# Patient Record
Sex: Male | Born: 1990 | Race: Asian | Hispanic: No | Marital: Single | Smoking: Never smoker
Health system: Southern US, Community
[De-identification: ages and names within clinical notes are randomized; demographics above are authoritative.]

## PROBLEM LIST (undated history)

## (undated) HISTORY — PX: PLEURAL SCARIFICATION: SHX748

## (undated) HISTORY — PX: HERNIA REPAIR: SHX51

---
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
## 2011-01-29 NOTE — Discharge Summary (Signed)
NAMENGAI, PARCELL NO.:  [id number]   MEDICAL RECORD NO.:  [id number]          PATIENT TYPE:  INP   LOCATION:  6118                         FACILITY:  [facility]   PHYSICIAN:  Henrietta Hoover, MD    DATE OF BIRTH:  [birth year]   DATE OF ADMISSION:  05/30/2008  DATE OF DISCHARGE:  06/02/2008                               DISCHARGE SUMMARY   SIGNIFICANT FINDINGS:  Christopher Moody is a 20 year old that presents with a 4-day  history of chest pain.  The patient noted severe onset of chest pain and  some shortness of breath and then presented to his primary care  physician's office where he got an EKG and was sent to [facility] for x-  rays which revealed a 40% left pneumothorax.  The patient was treated  with placement of a left chest tube.  Subsequent x-ray showed decrease  of the apical pneumothorax to 10%.  Chest tube was pulled and by  discharge the x-ray showed only a minimal apical pneumothorax which had  been stable since previous x-ray.  On discharge, the patient was without  pain with good oxygen saturation.   TREATMENT:  O2 by nasal cannula left chest tube.   FINAL DIAGNOSIS:  Left pneumothorax, resolved.   DISCHARGE MEDICATIONS AND INSTRUCTIONS:  Ibuprofen 600 mg q.6 h. as  needed for pain.  The patient was instructed to keep Tegaderm in place  until Tuesday.  He may shower but asked not to take bath or go swimming.  The patient was instructed to return if he has severe chest pain,  shortness of breath, or other concerns.   FOLLOWUP:  Followup at [facility] on Tuesday, June 07, 2008, at 4:30 p.m.  Follow up with Cardiology (for a previous issue) Dr.  Brion Aliment on June 09, 2008, at 1:20 p.m.   DISCHARGE WEIGHT:  64 kg.   DISCHARGE CONDITION:  Stable.      Pediatrics Resident      Henrietta Hoover, MD  Electronically Signed    PR/MEDQ  D:  06/02/2008  T:  06/03/2008  Job:  [id number]

## 2011-02-01 NOTE — Discharge Summary (Signed)
NAMEJUVENCIO, VERDI                   ACCOUNT NO.:  [id number]   MEDICAL RECORD NO.:  [id number]          PATIENT TYPE:  OBV   LOCATION:  6121                         FACILITY:  [facility]   PHYSICIAN:  Orie Rout, M.D.DATE OF BIRTH:  [birth year]   DATE OF ADMISSION:  07/10/2006  DATE OF DISCHARGE:  07/11/2006                                 DISCHARGE SUMMARY   REASON FOR HOSPITALIZATION:  Abdominal pain, sharp right-sided, 10/10.  The  purpose was to rule out appendicitis or other causes of acute abdomen.   HISTORY OF PRESENT ILLNESS:  The patient presented to the hospital with  sharp right-sided, 10/10, abdominal pain that went from the right lower  quadrant to the periumbilical area and then back to the right lower  quadrant.  This started around noon the day of admission.  For a full HPI,  please see the patient's H&P for this admission.   HOSPITAL COURSE:  Because the history was concerning for appendicitis, a CT  of the abdomen and pelvis was ordered.  This CT showed that the tip of the  appendix did not fill.  However, there were no inflammatory changes found on  the film and there was no free fluid seen in the abdomen or pelvis.  It was  thought, at this time, that the CT did not point to or against appendicitis.  The patient, however, was made n.p.o. and given 1 dose of morphine for the  pain, which relieved it to a 0/10 immediately.  The initial labs that were  ordered for the patient were a CBC, a CMP, a urinalysis, and coags.  The  white blood cell count was within normal limits at 6.6 and the only  abnormality seen from the other tests was a total bilirubin of 1.6.  Overnight serial abdominal exams were performed.  None of these exams were  concerning for appendicitis or peritonitis.  Also overnight, he did not  require any further pain medications.  In the morning, a.m. labs were drawn.  This was the morning of July 11, 2006.  A CBC, amylase, and a lipase were  drawn.  There were no abnormalities.  The white blood cell count had only  mildly increased from 6.6 to 8.0.  On exam the morning after admission, the  patient's pain was gone.  He did not exhibit any rebound tenderness or  guarding, or tenderness to palpation, and he remained afebrile throughout  his stay.  On the day after admission, he was able to tolerate clears and  tolerate solids.  He was also seen by Dr. Levie Heritage, the pediatric surgeon, who  felt that the patient was safely ruled out for appendicitis and could be  discharged home at that time.   OPERATIONS AND PROCEDURES:  None were performed during this admission.   FINAL DIAGNOSIS:  Abdominal pain, unspecified.  Appendicitis was felt to be  safely ruled out.   DISCHARGE MEDICATIONS AND INSTRUCTIONS:  The patient was given Tylenol 500  mg every 6 hours as needed for pain.  He was also instructed to return to  the hospital for worsening abdominal pain, return of any other symptoms, and  also a fever to greater than 101.4 degrees.   FOLLOWUP:  The patient is scheduled to follow up with Dr. Erik Obey on  Monday, July 14, 2006, at 2:30 p.m.   DISCHARGE WEIGHT:  50 kg.   DISCHARGE CONDITION:  Stable/improved.   The written form of this discharge summary was sent to Dr. Erik Obey at [phone].           ______________________________  Orie Rout, M.D.     OA/MEDQ  D:  07/11/2006  T:  07/12/2006  Job:  [id number]

## 2011-06-07 LAB — I-STAT 8, (EC8 V) (CONVERTED LAB)
BUN: 13
Chloride: 108
Glucose, Bld: 90
HCT: 44
pCO2, Ven: 48.9
pH, Ven: 7.294

## 2011-06-07 LAB — URINALYSIS, ROUTINE W REFLEX MICROSCOPIC
Bilirubin Urine: NEGATIVE
Glucose, UA: NEGATIVE
Hgb urine dipstick: NEGATIVE
Ketones, ur: NEGATIVE
Nitrite: NEGATIVE
Protein, ur: NEGATIVE
Specific Gravity, Urine: 1.025
Urobilinogen, UA: 0.2
pH: 5.5

## 2011-06-07 LAB — DIFFERENTIAL
Basophils Absolute: 0
Basophils Relative: 0
Eosinophils Absolute: 0.2
Eosinophils Relative: 3
Lymphocytes Relative: 41
Lymphs Abs: 3.1
Monocytes Absolute: 0.5
Monocytes Relative: 7
Neutro Abs: 3.8
Neutrophils Relative %: 50

## 2011-06-07 LAB — INFLUENZA A+B VIRUS AG-DIRECT(RAPID): Influenza B Ag: NEGATIVE

## 2011-06-07 LAB — RAPID URINE DRUG SCREEN, HOSP PERFORMED
Amphetamines: NOT DETECTED
Barbiturates: NOT DETECTED
Benzodiazepines: NOT DETECTED
Cocaine: NOT DETECTED
Opiates: NOT DETECTED
Tetrahydrocannabinol: NOT DETECTED

## 2011-06-07 LAB — CBC
HCT: 41.5
Hemoglobin: 14.1
MCHC: 34
MCV: 86.6
Platelets: 157
RBC: 4.79
RDW: 13.6
WBC: 7.6

## 2011-06-07 LAB — COMPREHENSIVE METABOLIC PANEL
ALT: 10
AST: 16
Albumin: 3.9
Alkaline Phosphatase: 74
BUN: 15
CO2: 26
Calcium: 8.7
Chloride: 109
Creatinine, Ser: 0.94
Glucose, Bld: 95
Potassium: 4.9
Sodium: 138
Total Bilirubin: 0.6
Total Protein: 6.4

## 2011-06-07 LAB — CK: Total CK: 63

## 2011-06-07 LAB — CULTURE, BLOOD (ROUTINE X 2): Culture: NO GROWTH

## 2011-06-07 LAB — POCT I-STAT CREATININE
Creatinine, Ser: 1.1
Operator id: 288331

## 2011-06-07 LAB — LIPASE, BLOOD: Lipase: 26

## 2011-06-07 LAB — MONONUCLEOSIS SCREEN: Mono Screen: NEGATIVE

## 2012-09-05 ENCOUNTER — Encounter (HOSPITAL_COMMUNITY): Payer: Self-pay | Admitting: *Deleted

## 2012-09-05 ENCOUNTER — Emergency Department (HOSPITAL_COMMUNITY): Payer: BC Managed Care – PPO

## 2012-09-05 ENCOUNTER — Emergency Department (HOSPITAL_COMMUNITY)
Admission: EM | Admit: 2012-09-05 | Discharge: 2012-09-05 | Disposition: A | Discharge status: Home / Self Care | Payer: BC Managed Care – PPO | Attending: Emergency Medicine | Admitting: Emergency Medicine

## 2012-09-05 DIAGNOSIS — S62319A Displaced fracture of base of unspecified metacarpal bone, initial encounter for closed fracture: Secondary | ICD-10-CM | POA: Insufficient documentation

## 2012-09-05 DIAGNOSIS — S62308A Unspecified fracture of other metacarpal bone, initial encounter for closed fracture: Secondary | ICD-10-CM

## 2012-09-05 DIAGNOSIS — Y929 Unspecified place or not applicable: Secondary | ICD-10-CM | POA: Insufficient documentation

## 2012-09-05 DIAGNOSIS — W010XXA Fall on same level from slipping, tripping and stumbling without subsequent striking against object, initial encounter: Secondary | ICD-10-CM | POA: Insufficient documentation

## 2012-09-05 DIAGNOSIS — Y9389 Activity, other specified: Secondary | ICD-10-CM | POA: Insufficient documentation

## 2012-09-05 MED ORDER — OXYCODONE-ACETAMINOPHEN 5-325 MG PO TABS
2.0000 | ORAL_TABLET | Freq: Once | ORAL | Status: AC
  Administered 2012-09-05: 2 via ORAL
  Filled 2012-09-05: qty 1

## 2012-09-05 MED ORDER — OXYCODONE-ACETAMINOPHEN 5-325 MG PO TABS: 1.0000 | ORAL_TABLET | Freq: Four times a day (QID) | ORAL | Status: DC | PRN

## 2012-09-05 NOTE — ED Notes (Signed)
Pt from home, alert and oriented, sts he tripped earlier and landed with all of his weight on his right hand .Patient right hand swollen and tender with limited movement in 2 fingers. Patient sts he took 3 advil earlier for pain relief.

## 2012-09-05 NOTE — ED Provider Notes (Signed)
History     CSN: [id number]  Arrival date & time 09/05/12  1826   First MD Initiated Contact with Patient 09/05/12 1830      Chief Complaint  Patient presents with  . Hand Pain    (Consider location/radiation/quality/duration/timing/severity/associated sxs/prior treatment) HPI Comments: Patient presents with pain and swelling of the dorsal aspect of the right hand worse over the 4th and 5th metacarpal.  He reports that approximately three hours prior to arrival he fell unto his hand while his hand was in a fist.  He fell from a standing position.  He has pain of his right hand, but no other pain.  No pain of his right wrist.  He is able to move his fingers.  Pain worse with palpation and with movement of the 4th and 5th digits.  He took three Advil for pain prior to arrival.  He denies numbness or tingling.  No laceration or break in the skin.    Patient is a 21 y.o. male presenting with hand pain. The history is provided by the patient.  Hand Pain This is a new problem. Episode onset: three hours prior to arrival. The problem has been unchanged. Pertinent negatives include no numbness. Exacerbated by: palpation, movement of the 4th and 5th digit. He has tried nothing for the symptoms.    History reviewed. No pertinent past medical history.  Past Surgical History  Procedure Date  . Hernia repair   . Pleural scarification     No family history on file.  History  Substance Use Topics  . Smoking status: Never Smoker   . Smokeless tobacco: Never Used  . Alcohol Use: No      Review of Systems  Musculoskeletal:       Right hand pain  Neurological: Negative for numbness.  All other systems reviewed and are negative.    Allergies  Review of patient's allergies indicates no known allergies.  Home Medications   Current Outpatient Rx  Name  Route  Sig  Dispense  Refill  . IBUPROFEN 200 MG PO TABS   Oral   Take 600 mg by mouth every 8 (eight) hours as needed. For  pain.           BP 125/63  Pulse 69  Temp 98.4 F (36.9 C) (Oral)  Resp 18  Ht 5\' 11"  (1.803 m)  Wt 125 lb (56.7 kg)  BMI 17.43 kg/m2  SpO2 98%  Physical Exam  Nursing note and vitals reviewed. Constitutional: He appears well-developed and well-nourished.  HENT:  Head: Normocephalic and atraumatic.  Cardiovascular: Normal rate, regular rhythm and normal heart sounds.   Pulses:      Radial pulses are 2+ on the right side, and 2+ on the left side.  Pulmonary/Chest: Effort normal and breath sounds normal.  Musculoskeletal:       Right wrist: He exhibits normal range of motion, no tenderness, no bony tenderness and no swelling.       Swelling of the dorsal aspect of the right hand.  Full ROM of all fingers.  Neurological: He is alert. No sensory deficit.  Skin: Skin is warm and dry.  Psychiatric: He has a normal mood and affect.    ED Course  Procedures (including critical care time)  Labs Reviewed - No data to display Dg Hand Complete Right  09/05/2012  *RADIOLOGY REPORT*  Clinical Data: Larey Seat onto his fist, pain  RIGHT HAND - COMPLETE 3+ VIEW  Comparison: None  Findings: Osseous mineralization normal.  Joint spaces preserved. Fractures are identified at the bases of the fourth and fifth metacarpals, mildly displaced dorsally. Minimal apex dorsal angulation. Overlying soft tissue swelling. No additional fracture, dislocation, or bone destruction.  IMPRESSION: Dorsally displaced fractures at the bases of the fourth and fifth metacarpals.   Original Report Authenticated By: Ulyses Southward, M.D.      No diagnosis found.    MDM  Patient with minimally displaced closed fracture of the 4th and 5th metacarpal.  Patient neurovascularly intact.  Discussed xray results with Dr. Judd Lien.  Patient placed in an Ulnar Gutter splint.  Patient instructed to follow up with Orthopedics.  Patient in agreement with the plan.  Return precautions discussed.        Pascal Lux Burt,  PA-C 09/05/12 2322

## 2012-09-05 NOTE — ED Notes (Signed)
Patient given discharge instructions, information, prescriptions, and diet order. Patient states that they adequately understand discharge information given and to return to ED if symptoms return or worsen.     

## 2012-09-06 NOTE — ED Provider Notes (Signed)
Medical screening examination/treatment/procedure(s) were performed by non-physician practitioner and as supervising physician I was immediately available for consultation/collaboration.  Joesph Marcy, MD 09/06/12 2108 

## 2013-09-26 ENCOUNTER — Emergency Department (HOSPITAL_COMMUNITY): Payer: Self-pay

## 2013-09-26 ENCOUNTER — Encounter (HOSPITAL_COMMUNITY): Payer: Self-pay | Admitting: Emergency Medicine

## 2013-09-26 ENCOUNTER — Emergency Department (HOSPITAL_COMMUNITY)
Admission: EM | Admit: 2013-09-26 | Discharge: 2013-09-27 | Disposition: A | Discharge status: Home / Self Care | Payer: Self-pay | Attending: Emergency Medicine | Admitting: Emergency Medicine

## 2013-09-26 DIAGNOSIS — R0789 Other chest pain: Secondary | ICD-10-CM

## 2013-09-26 DIAGNOSIS — R071 Chest pain on breathing: Secondary | ICD-10-CM | POA: Insufficient documentation

## 2013-09-26 LAB — COMPREHENSIVE METABOLIC PANEL
ALK PHOS: 79 U/L (ref 39–117)
ALT: 14 U/L (ref 0–53)
AST: 19 U/L (ref 0–37)
Albumin: 5.1 g/dL (ref 3.5–5.2)
BUN: 15 mg/dL (ref 6–23)
CALCIUM: 9.8 mg/dL (ref 8.4–10.5)
CO2: 24 mEq/L (ref 19–32)
Chloride: 98 mEq/L (ref 96–112)
Creatinine, Ser: 1.11 mg/dL (ref 0.50–1.35)
GFR calc non Af Amer: >90 mL/min (ref >90)
GLUCOSE: 96 mg/dL (ref 70–99)
POTASSIUM: 4 meq/L (ref 3.7–5.3)
SODIUM: 138 meq/L (ref 137–147)
TOTAL PROTEIN: 8.2 g/dL (ref 6.0–8.3)
Total Bilirubin: 0.5 mg/dL (ref 0.3–1.2)

## 2013-09-26 LAB — CBC
HCT: 47.6 % (ref 39.0–52.0)
HEMOGLOBIN: 16.5 g/dL (ref 13.0–17.0)
MCH: 30.3 pg (ref 26.0–34.0)
MCHC: 34.7 g/dL (ref 30.0–36.0)
MCV: 87.5 fL (ref 78.0–100.0)
PLATELETS: 184 10*3/uL (ref 150–400)
RBC: 5.44 MIL/uL (ref 4.22–5.81)
RDW: 13.4 % (ref 11.5–15.5)
WBC: 9.9 10*3/uL (ref 4.0–10.5)

## 2013-09-26 NOTE — ED Provider Notes (Signed)
CSN: [id number]     Arrival date & time 09/26/13  2243 History   None    Chief Complaint  Patient presents with  . Chest Pain   (Consider location/radiation/quality/duration/timing/severity/associated sxs/prior Treatment) The history is provided by the patient and medical records. No language interpreter was used.    EDI Christopher Moody is a 23 y.o. male  with a hx of hemothorax of unknown origin at the age of 60, hernia repair presents to the Emergency Department complaining of gradual, persistent, progressively worsening left arm pain onset 3 days ago and then tonight while in the emergency department with a friend had sudden onset left-sided chest pain with persistent left arm pain. Patient reports increased weight lifting and "working out" recently.  He denies history of cardiac problems. Movement and palpation makes his pain worse and nothing seems to make it better.  History reviewed. No pertinent past medical history. Past Surgical History  Procedure Laterality Date  . Hernia repair    . Pleural scarification     No family history on file. History  Substance Use Topics  . Smoking status: Never Smoker   . Smokeless tobacco: Never Used  . Alcohol Use: No    Review of Systems  Constitutional: Negative for fever, diaphoresis, appetite change, fatigue and unexpected weight change.  HENT: Negative for mouth sores.   Eyes: Negative for visual disturbance.  Respiratory: Negative for cough, chest tightness, shortness of breath and wheezing.   Cardiovascular: Positive for chest pain.  Gastrointestinal: Negative for nausea, vomiting, abdominal pain, diarrhea and constipation.  Endocrine: Negative for polydipsia, polyphagia and polyuria.  Genitourinary: Negative for dysuria, urgency, frequency and hematuria.  Musculoskeletal: Positive for arthralgias (L arm). Negative for back pain and neck stiffness.  Skin: Negative for rash.  Allergic/Immunologic: Negative for immunocompromised state.   Neurological: Negative for syncope, light-headedness and headaches.  Hematological: Does not bruise/bleed easily.  Psychiatric/Behavioral: Negative for sleep disturbance. The patient is not nervous/anxious.     Allergies  Apple and Pear  Home Medications   Current Outpatient Rx  Name  Route  Sig  Dispense  Refill  . diphenhydrAMINE (BENADRYL) 25 MG tablet   Oral   Take 25 mg by mouth every 6 (six) hours as needed for allergies (or nausea).          BP 127/71  Pulse 69  Temp(Src) 98.1 F (36.7 C) (Oral)  Resp 18  SpO2 98% Physical Exam  Nursing note and vitals reviewed. Constitutional: He is oriented to person, place, and time. He appears well-developed and well-nourished. No distress.  Awake, alert, nontoxic appearance  HENT:  Head: Normocephalic and atraumatic.  Mouth/Throat: Oropharynx is clear and moist. No oropharyngeal exudate.  Eyes: Conjunctivae are normal. No scleral icterus.  Neck: Normal range of motion. Neck supple.  Cardiovascular: Normal rate, regular rhythm, normal heart sounds and intact distal pulses.   No murmur heard. Pulmonary/Chest: Effort normal and breath sounds normal. No accessory muscle usage. Not tachypneic. No respiratory distress. He has no wheezes. He exhibits tenderness.  Left-sided chest tenderness   Abdominal: Soft. Bowel sounds are normal. He exhibits no distension and no mass. There is no tenderness. There is no rebound and no guarding.  Musculoskeletal: Normal range of motion. He exhibits no edema.  Lymphadenopathy:    He has no cervical adenopathy.  Neurological: He is alert and oriented to person, place, and time. He exhibits normal muscle tone. Coordination normal.  Speech is clear and goal oriented Moves extremities without ataxia  Skin: Skin is warm and dry. He is not diaphoretic. No erythema.  Psychiatric: He has a normal mood and affect. His behavior is normal.    ED Course  Procedures (including critical care time) Labs  Review Labs Reviewed  CBC  COMPREHENSIVE METABOLIC PANEL  POCT I-STAT TROPONIN I   Imaging Review Dg Chest 2 View  09/27/2013   CLINICAL DATA:  Chest pain  EXAM: CHEST  2 VIEW  COMPARISON:  06/02/2008  FINDINGS: The heart size and mediastinal contours are within normal limits. Both lungs are clear. The visualized skeletal structures are unremarkable.  IMPRESSION: No active cardiopulmonary disease.   Electronically Signed   By: Tiburcio PeaJonathan  Watts M.D.   On: 09/27/2013 00:07    EKG Interpretation   None      ECG:  Date: 09/27/2013  Rate: 63  Rhythm: normal sinus rhythm  QRS Axis: normal  Intervals: normal  ST/T Wave abnormalities: nonspecific T wave changes  Conduction Disutrbances:none  Narrative Interpretation: Nonischemic ECG, no old for comparison  Old EKG Reviewed: none available    MDM   1. Chest wall pain      [name] presents with left-sided chest pain. Nursing notes reviewed the patient is not lethargic and is able to speak without difficulty when prompted.  Highly doubt ACS, will obtain chest x-ray to rule out pneumothorax.  12:34 AM Chest pain is not likely of cardiac or pulmonary etiology d/t presentation, perc negative, VSS, no tracheal deviation, no JVD or new murmur, RRR, breath sounds equal bilaterally, EKG without acute abnormalities, negative troponin, and negative CXR.  Patient has been advised to take ibuprofen and decrease his weightlifting.  Patient is to be discharged with recommendation to follow up with PCP in regards to today's hospital visit.  Pt appears reliable for follow up and is agreeable to discharge.    It has been determined that no acute conditions requiring further emergency intervention are present at this time. The patient/guardian have been advised of the diagnosis and plan. We have discussed signs and symptoms that warrant return to the ED, such as changes or worsening in symptoms.   Vital signs are stable at discharge.   BP 127/71   Pulse 69  Temp(Src) 98.1 F (36.7 C) (Oral)  Resp 18  SpO2 98%  Patient/guardian has voiced understanding and agreed to follow-up with the PCP or specialist.      Dierdre ForthHannah Marlisha Vanwyk, PA-C 09/27/13 [phone]

## 2013-09-26 NOTE — ED Notes (Signed)
Per pt he has a hx of pneumothorax.  Pt states he had a sudden onset of left chest pain radiating down left arm sharp and shooting in nature.  Pt is having trouble finding words at this time and appears lethargic.

## 2013-09-27 LAB — POCT I-STAT TROPONIN I: Troponin i, poc: 0 ng/mL (ref 0.00–0.08)

## 2013-09-27 NOTE — Discharge Instructions (Signed)
1. Medications: Ibuprofen, usual home medications 2. Treatment: rest, drink plenty of fluids,  3. Follow Up: Please followup with your primary doctor for discussion of your diagnoses and further evaluation after today's visit; if you do not have a primary care doctor use the resource guide provided to find one;    Chest Wall Pain Chest wall pain is pain in or around the bones and muscles of your chest. It may take up to 6 weeks to get better. It may take longer if you must stay physically active in your work and activities.  CAUSES  Chest wall pain may happen on its own. However, it may be caused by:  A viral illness like the flu.  Injury.  Coughing.  Exercise.  Arthritis.  Fibromyalgia.  Shingles. HOME CARE INSTRUCTIONS   Avoid overtiring physical activity. Try not to strain or perform activities that cause pain. This includes any activities using your chest or your abdominal and side muscles, especially if heavy weights are used.  Put ice on the sore area.  Put ice in a plastic bag.  Place a towel between your skin and the bag.  Leave the ice on for 15-20 minutes per hour while awake for the first 2 days.  Only take over-the-counter or prescription medicines for pain, discomfort, or fever as directed by your caregiver. SEEK IMMEDIATE MEDICAL CARE IF:   Your pain increases, or you are very uncomfortable.  You have a fever.  Your chest pain becomes worse.  You have new, unexplained symptoms.  You have nausea or vomiting.  You feel sweaty or lightheaded.  You have a cough with phlegm (sputum), or you cough up blood. MAKE SURE YOU:   Understand these instructions.  Will watch your condition.  Will get help right away if you are not doing well or get worse. Document Released: 09/02/2005 Document Revised: 11/25/2011 Document Reviewed: 04/29/2011 [facility] Patient Information 2014 [employer], [place].

## 2013-09-27 NOTE — ED Provider Notes (Signed)
Medical screening examination/treatment/procedure(s) were performed by non-physician practitioner and as supervising physician I was immediately available for consultation/collaboration.  EKG Interpretation   None       Date: 09/26/2013  Rate: 63  Rhythm: normal sinus rhythm  QRS Axis: normal  Intervals: normal  ST/T Wave abnormalities: early repolarization  Conduction Disutrbances:none  Narrative Interpretation:   Old EKG Reviewed: unchanged    Olivia Mackielga M Cha Gomillion, MD 09/27/13 [phone]

## 2021-01-15 ENCOUNTER — Other Ambulatory Visit: Payer: Self-pay | Admitting: Nurse Practitioner

## 2021-01-15 ENCOUNTER — Other Ambulatory Visit: Payer: Self-pay

## 2021-01-15 ENCOUNTER — Ambulatory Visit
Admission: RE | Admit: 2021-01-15 | Discharge: 2021-01-15 | Disposition: A | Discharge status: Home / Self Care | Payer: No Typology Code available for payment source | Source: Ambulatory Visit | Attending: Nurse Practitioner | Admitting: Nurse Practitioner

## 2021-01-15 DIAGNOSIS — Z021 Encounter for pre-employment examination: Secondary | ICD-10-CM

## 2022-09-05 ENCOUNTER — Encounter (HOSPITAL_BASED_OUTPATIENT_CLINIC_OR_DEPARTMENT_OTHER): Payer: Self-pay

## 2022-09-05 ENCOUNTER — Emergency Department (HOSPITAL_BASED_OUTPATIENT_CLINIC_OR_DEPARTMENT_OTHER)
Admission: EM | Admit: 2022-09-05 | Discharge: 2022-09-05 | Disposition: A | Discharge status: Home / Self Care | Attending: Emergency Medicine | Admitting: Emergency Medicine

## 2022-09-05 ENCOUNTER — Other Ambulatory Visit: Payer: Self-pay

## 2022-09-05 DIAGNOSIS — H538 Other visual disturbances: Secondary | ICD-10-CM | POA: Diagnosis present

## 2022-09-05 DIAGNOSIS — H3322 Serous retinal detachment, left eye: Secondary | ICD-10-CM | POA: Diagnosis not present

## 2022-09-05 MED ORDER — TROPICAMIDE 1 % OP SOLN
1.0000 [drp] | Freq: Once | OPHTHALMIC | Status: AC
  Administered 2022-09-05: 1 [drp] via OPHTHALMIC
  Filled 2022-09-05: qty 15

## 2022-09-05 MED ORDER — TETRACAINE HCL 0.5 % OP SOLN
2.0000 [drp] | Freq: Once | OPHTHALMIC | Status: DC
  Filled 2022-09-05: qty 4

## 2022-09-05 MED ORDER — PHENYLEPHRINE HCL 2.5 % OP SOLN
1.0000 [drp] | Freq: Once | OPHTHALMIC | Status: AC
  Administered 2022-09-05: 1 [drp] via OPHTHALMIC
  Filled 2022-09-05: qty 2

## 2022-09-05 NOTE — ED Notes (Signed)
Ophthalmologist at bedside.

## 2022-09-05 NOTE — Consult Note (Signed)
Ophthalmology Initial Consult Note   Christopher Moody, Christopher Moody, 31 y.o. male Date of Service:  09/05/2022 CSN: [id number]  MRN: [id number]   Requesting physician: Arby Barrette, MD  Information Obtained from: patient Chief Complaint:  vision loss left eye  HPI/Discussion:  Christopher Moody is a 31 y.o. male who presents with vision loss of his superior vision in the left eye. He first noticed vision loss approximately 4 days ago and this has progressed with the sensation of a curtain coming over his vision. Denies VA loss OD.   Past Ocular Hx:  wears CLs, dailies Ocular Meds:  none Family ocular history: Denies glaucoma, AMD. Some vision loss due to diabetes.  History reviewed. No pertinent past medical history. 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 No pertinent family history.  ROS: Other than ROS in the HPI, all other systems were negative.  Exam: Temp: 98.3 F (36.8 C) Pulse Rate: 80 BP: (!) 143/97 Resp: 18 SpO2: 98 %  Visual Acuity:  [place]  cc  ph  Near cc CL's   OD  +     20/25   OS  +      20/800 ecc     OD OS  Confr Vis Fields WNL WNL  EOM (Primary) WNL WNL  Conjunctiva - Bulbar WNL WNL  Conjunctiva - Palpebral               WNL WNL  Adnexa  WNL WNL  Pupils  WNL APD  Reaction, Direct WNL WNL                 Consensual WNL WNL                 RAPD No Yes - unless from inferior angle  Cornea  WNL WNL  Anterior Chamber WNL WNL  Lens:  WNL WNL  IOP 19 10  Fundus - Dilated? 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    Needs urgent retina evaluation and treatment, recommend going to [facility] tomorrow at 8AM, NPO at [facility].  Dairl Ponder 09/05/2022, 8:42 PM Ophthalmic Plastic [facility] [phone]

## 2022-09-05 NOTE — ED Triage Notes (Signed)
Patient arrives POV c/o impaired vision to left eye. Pt states "more than half of vision" in his left eye is blocked; pt states "something is obscuring his vision." Pt states the initial blurriness started on Sunday, and progressively worsened. Pt denies any LOC or head trauma or chemical exposure. Pt states right vision is intact.

## 2022-09-05 NOTE — Discharge Instructions (Signed)
1.  Follow-up tomorrow as directed by Dr. Darcel Bayley.  Are being seen tomorrow for surgical evaluation and treatment.  Do not eat anything after 2 AM this morning.

## 2022-09-05 NOTE — ED Provider Notes (Signed)
[facility] EMERGENCY DEPARTMENT Provider Note   CSN: [id number] Arrival date & time: 09/05/22  1849     History  Chief Complaint  Patient presents with   Visual Field Change    Christopher Moody is a 31 y.o. male.  HPI He does not have significant medical history.  He reports that about 4 days earlier he started getting some blurriness and loss of vision in the very upper aspect of the left eye.  The past 2 days it has progressed like a curtain coming down over the vision.  He reports he is up-to-date it became severe and now his vision is mostly dark down to about three quarters from the top.  He is still see images and movement in the lower aspect of his visual field but cannot focus on anything.  Reports its mostly dark but very bright sunlight comes through.  Denies any changes in vision on the right.  There has been no trauma.    Home Medications Prior to Admission medications   Medication Sig Start Date End Date Taking? Authorizing Provider  diphenhydrAMINE (BENADRYL) 25 MG tablet Take 25 mg by mouth every 6 (six) hours as needed for allergies (or nausea).    [provider]      Allergies    Apple juice and Pear    Review of Systems   Review of Systems  Physical Exam Updated Vital Signs BP (!) 145/90   Pulse 88   Temp 98.4 F (36.9 C) (Oral)   Resp 18   Ht 5\' 11"  (1.803 m)   Wt 84.8 kg   SpO2 99%   BMI 26.07 kg/m  Physical Exam Constitutional:      Appearance: Normal appearance.  HENT:     Mouth/Throat:     Pharynx: Oropharynx is clear.  Eyes:     Comments: Patient ocular motions intact.  Pupils are symmetric.  Funduscopic exam on the right shows retina with vascular pattern grossly normal in appearance with limited exam due to undilated eye.  Left eye redness is not visualized, dark reddish appearance without any clear retinal markings.  Pulmonary:     Effort: Pulmonary effort is normal.  Musculoskeletal:        General: Normal range of  motion.  Skin:    General: Skin is warm and dry.  Neurological:     General: No focal deficit present.     Mental Status: He is alert and oriented to person, place, and time.     Cranial Nerves: No cranial nerve deficit.     Motor: No weakness.     Coordination: Coordination normal.  Psychiatric:        Mood and Affect: Mood normal.     ED Results / Procedures / Treatments   Labs (all labs ordered are listed, but only abnormal results are displayed) Labs Reviewed - No data to display  EKG None  Radiology No results found.  Procedures Procedures    Medications Ordered in ED Medications  phenylephrine (MYDFRIN) 2.5 % ophthalmic solution 1 drop (1 drop Left Eye Given by Other 09/05/22 2150)  tropicamide (MYDRIACYL) 1 % ophthalmic solution 1 drop (1 drop Left Eye Given by Other 09/05/22 2151)    ED Course/ Medical Decision Making/ A&P                           Medical Decision Making Risk Prescription drug management.   Consult: Reviewed with Dr.  [facility] ophthalmology.  He will assess the patient in the emergency department.  Patient presents as outlined.  At this time findings are consistent with retinal detachment.  This is nontraumatic.  Patient is otherwise clinically well in appearance.  Patient has had consultation with Dr. Darcel Bayley in the emergency department.  After doing assessment Dr. Darcel Bayley agrees this is retinal detachment and patient will need urgent treatment.  His recommendation is that the patient go in the morning to [facility] ophthalmology clinic with diagnosis of retinal detachment with anticipated management on presentation.  No further instructions for treatment in the emergency department.        Final Clinical Impression(s) / ED Diagnoses Final diagnoses:  Left retinal detachment    Rx / DC Orders ED Discharge Orders     None         Arby Barrette, MD 09/07/22 0021

## 2022-09-05 NOTE — ED Notes (Signed)
Dc instructions reviewed with pt. Pt to follow up at [facility] tomorrow at 8am. Pt verbalized understanding no questions or concerns at this time.

## 2022-12-09 IMAGING — CR DG CHEST 1V
1 series · 1 of 1 positions shown · non-contrast
Comparison: Radiograph 09/26/2013

CLINICAL DATA: Pre-employment exam.

EXAM:
CHEST  1 VIEW

[w chest pa]
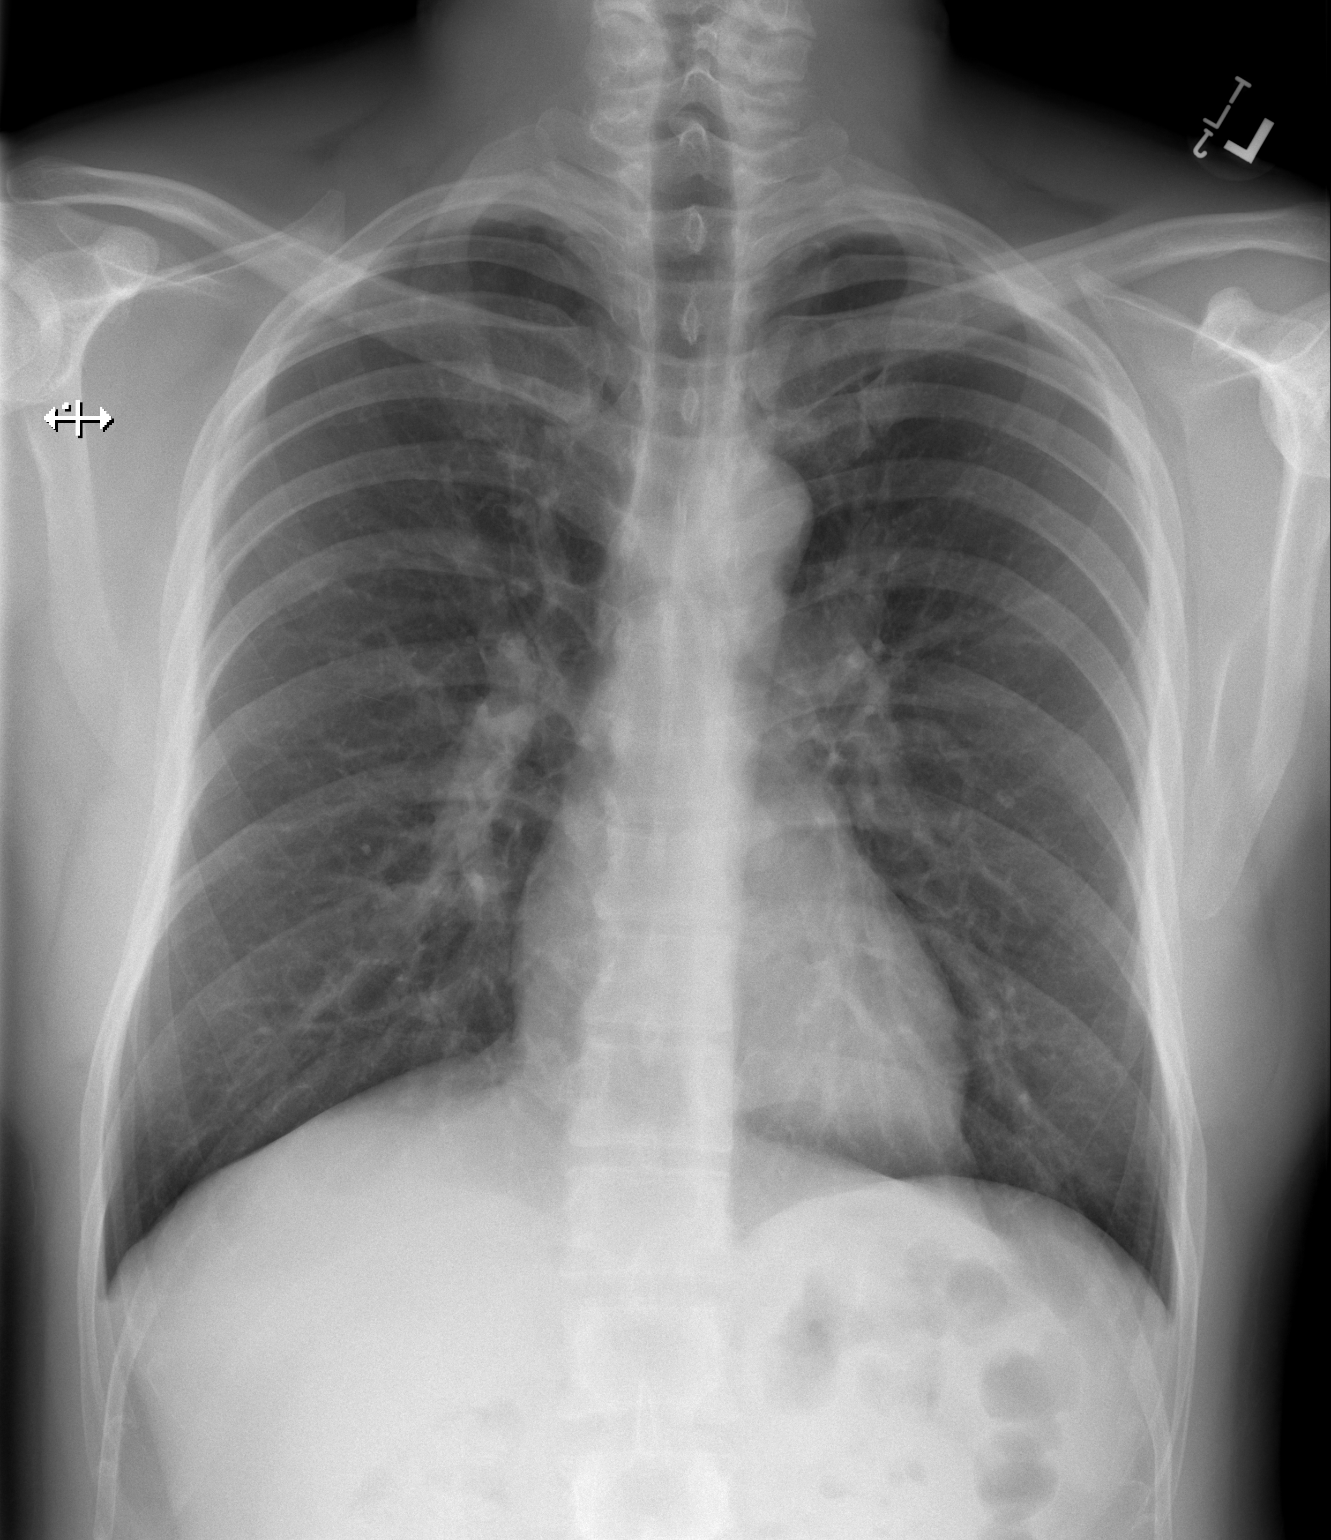

[1 of 1 positions shown; findings below may reference images not displayed]

FINDINGS: The cardiomediastinal contours are normal. The lungs are clear.
Pulmonary vasculature is normal. No consolidation, pleural effusion,
or pneumothorax. No acute osseous abnormalities are seen.
IMPRESSION: Negative frontal view of the chest.

## 2023-05-22 ENCOUNTER — Ambulatory Visit
Admission: EM | Admit: 2023-05-22 | Discharge: 2023-05-22 | Disposition: A | Discharge status: Home / Self Care | Attending: Internal Medicine | Admitting: Internal Medicine

## 2023-05-22 DIAGNOSIS — U071 COVID-19: Secondary | ICD-10-CM

## 2023-05-22 MED ORDER — PROMETHAZINE-DM 6.25-15 MG/5ML PO SYRP: 5.0000 mL | ORAL_SOLUTION | Freq: Three times a day (TID) | ORAL | 0 refills | Status: AC | PRN

## 2023-05-22 MED ORDER — PAXLOVID (300/100) 20 X 150 MG & 10 X 100MG PO TBPK: ORAL_TABLET | ORAL | 0 refills | Status: AC

## 2023-05-22 NOTE — Discharge Instructions (Addendum)
We will manage this COVID infection with Paxlovid. For sore throat or cough try using a honey-based tea. Use 3 teaspoons of honey with juice squeezed from half lemon. Place shaved pieces of ginger into 1/2-1 cup of water and warm over stove top. Then mix the ingredients and repeat every 4 hours as needed. Please take ibuprofen 600mg  every 6 hours with food alternating with OR taken together with Tylenol 650mg  every 6 hours for throat pain, fevers, aches and pains. Hydrate very well with at least 2 liters of water. Eat light meals such as soups (chicken and noodles, vegetable, chicken and wild rice).  Do not eat foods that you are allergic to.  Taking an antihistamine like Zyrtec (10mg  daily) can help against postnasal drainage, sinus congestion which can cause sinus pain, sinus headaches, throat pain, painful swallowing, coughing.  Use cough syrup as needed.

## 2023-05-22 NOTE — ED Provider Notes (Signed)
[facility] - URGENT [facility]ARE [facility]  Note:  This document was prepared using Conservation officer, historic buildings and may include unintentional dictation errors.  MRN: [id number] DOB: May 06, 1991  Subjective:   Christopher Moody is a 32 y.o. male presenting for 1 day history of acute onset persistent body pains, coughing.  Took a COVID test and was positive.  No chest pain, shortness of breath or wheezing.  Would like a cough syrup and COVID antivirals, confirmation testing.  No smoking of any kind including cigarettes, cigars, vaping, marijuana use.  No history of respiratory disorders.  No current facility-administered medications for this encounter.  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 No pertinent past medical history.   Past Surgical History:  Procedure Laterality Date   HERNIA REPAIR     PLEURAL SCARIFICATION      History reviewed. No pertinent family history.  Social History   Tobacco Use   Smoking status: Never   Smokeless tobacco: Never  Vaping Use   Vaping status: Never Used  Substance Use Topics   Alcohol use: Yes    Comment: socially   Drug use: No     ROS   Objective:   Vitals: BP (!) 151/89 (BP Location: Left Arm)   Pulse (!) 112   Temp 99.6 F (37.6 C) (Oral)   Resp 16   SpO2 98%   Physical Exam Constitutional:      General: He is not in acute distress.    Appearance: Normal appearance. He is well-developed. He is not ill-appearing, toxic-appearing or diaphoretic.  HENT:     Head: Normocephalic and atraumatic.     Right Ear: External ear normal.     Left Ear: External ear normal.     Nose: Nose normal.     Mouth/Throat:     Mouth: Mucous membranes are moist.  Eyes:     General: No scleral icterus.       Right eye: No discharge.        Left eye: No discharge.     Extraocular Movements: Extraocular movements intact.  Cardiovascular:     Rate and Rhythm: Normal rate and regular rhythm.     Heart sounds: Normal heart sounds. No murmur heard.    No friction rub. No gallop.  Pulmonary:     Effort: Pulmonary effort is normal. No respiratory distress.     Breath sounds: Normal breath sounds. No stridor. No wheezing, rhonchi or rales.  Neurological:     Mental Status: He is alert and oriented to person, place, and time.  Psychiatric:        Mood and Affect: Mood normal.        Behavior: Behavior normal.        Thought Content: Thought content normal.     Assessment and Plan :   PDMP not reviewed this encounter.  1. Clinical diagnosis of COVID-19    Provided patient with a prescription for Paxlovid.  Recommend supportive care otherwise.  Confirmation testing pending.  Deferred imaging given clear pulmonary exam.  Counseled patient on potential for adverse effects with medications prescribed/recommended today, ER and return-to-clinic precautions discussed, patient verbalized understanding.    Wallis Bamberg, PA-C 05/22/23 1357

## 2023-05-22 NOTE — ED Triage Notes (Signed)
Pt reports body aches and cough started today. Positive COVID test today at home.

## 2023-05-23 LAB — SARS CORONAVIRUS 2 (TAT 6-24 HRS): SARS Coronavirus 2: POSITIVE — AB

## 2023-08-08 ENCOUNTER — Ambulatory Visit
Admission: EM | Admit: 2023-08-08 | Discharge: 2023-08-08 | Disposition: A | Discharge status: Home / Self Care | Attending: Internal Medicine | Admitting: Internal Medicine

## 2023-08-08 DIAGNOSIS — J018 Other acute sinusitis: Secondary | ICD-10-CM | POA: Diagnosis not present

## 2023-08-08 MED ORDER — AMOXICILLIN 875 MG PO TABS: 875.0000 mg | ORAL_TABLET | Freq: Two times a day (BID) | ORAL | 0 refills | Status: AC

## 2023-08-08 MED ORDER — PSEUDOEPHEDRINE HCL 60 MG PO TABS: 60.0000 mg | ORAL_TABLET | Freq: Three times a day (TID) | ORAL | 0 refills | Status: AC | PRN

## 2023-08-08 MED ORDER — CETIRIZINE HCL 10 MG PO TABS: 10.0000 mg | ORAL_TABLET | Freq: Every day | ORAL | 0 refills | Status: AC

## 2023-08-08 NOTE — Discharge Instructions (Addendum)
We will manage this as a sinus infection with amoxicillin. For sore throat or cough try using a honey-based tea. Use 3 teaspoons of honey with juice squeezed from half lemon. Place shaved pieces of ginger into 1/2-1 cup of water and warm over stove top. Then mix the ingredients and repeat every 4 hours as needed. Please take ibuprofen 600mg  every 6 hours with food alternating with OR taken together with Tylenol 500mg -650mg  every 6 hours for throat pain, fevers, aches and pains. Hydrate very well with at least 2 liters of water. Eat light meals such as soups (chicken and noodles, vegetable, chicken and wild rice).  Do not eat foods that you are allergic to.  Taking an antihistamine like Zyrtec can help against postnasal drainage, sinus congestion which can cause sinus pain, sinus headaches, throat pain, painful swallowing, coughing.  You can take this together with pseudoephedrine (Sudafed) at a dose of 60 mg 3 times a day or twice daily as needed for the same kind of nasal drip, congestion.  Use cough medication as needed.

## 2023-08-08 NOTE — ED Triage Notes (Signed)
Pt reports cough x 1 week, cough is keeping his awake for teh past 2 nights. Dayquil gives some relief.

## 2023-08-08 NOTE — ED Provider Notes (Signed)
[facility] - [facility]  Note:  This document was prepared using Conservation officer, historic buildings and may include unintentional dictation errors.  MRN: [id number] DOB: [birth year]  Subjective:   Christopher Moody is a 32 y.o. male presenting for 7-day history of acute onset worsening malaise, fatigue.  Initially started out with cold symptoms that in the past 2 days has gotten much worse.  Now has a very productive cough with yellow mucus, sinus pain, sinus pressure, subjective fever. No smoking of any kind including cigarettes, cigars, vaping, marijuana use.  No asthma.   No current facility-administered medications for this encounter.  Current Outpatient Medications:    Pseudoephedrine-APAP-DM (DAYQUIL PO), Take by mouth., Disp: , Rfl:    diphenhydrAMINE (BENADRYL) 25 MG tablet, Take 25 mg by mouth every 6 (six) hours as needed for allergies (or nausea)., Disp: , Rfl:    nirmatrelvir & ritonavir (PAXLOVID, 300/100,) 20 x 150 MG & 10 x 100MG  TBPK, Take 2 tablets nirmtrelvir and 1 tablet ritonavir twice daily., Disp: 30 tablet, Rfl: 0   promethazine-dextromethorphan (PROMETHAZINE-DM) 6.25-15 MG/5ML syrup, Take 5 mLs by mouth 3 (three) times daily as needed for cough., Disp: 200 mL, Rfl: 0   Allergies  Allergen Reactions   Apple Anaphylaxis, Itching and Rash    "any germinating fruits"- 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; so much so that he cannot eat fruits that have been pollinated   "any germinating fruits"- 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; so much so that he cannot eat fruits that have been pollinated   Apple Juice Anaphylaxis, Itching and Rash    "any germinating fruits"- 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; so much so that he cannot eat fruits that have been pollinated    Pear Anaphylaxis, Itching and Rash    "any germinating fruits"- 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; so much so that he cannot eat fruits that have been pollinated  "any germinating  fruits"- 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 No pertinent past medical history.   Past Surgical History:  Procedure Laterality Date   HERNIA REPAIR     PLEURAL SCARIFICATION      History reviewed. No pertinent family history.  Social History   Tobacco Use   Smoking status: Never   Smokeless tobacco: Never  Vaping Use   Vaping status: Never Used  Substance Use Topics   Alcohol use: Yes    Comment: socially   Drug use: No    ROS   Objective:   Vitals: BP (!) 147/88 (BP Location: Left Arm)   Pulse 83   Temp 98.4 F (36.9 C) (Oral)   Resp 18   SpO2 95%   Physical Exam Constitutional:      General: He is not in acute distress.    Appearance: Normal appearance. He is well-developed and normal weight. He is not ill-appearing, toxic-appearing or diaphoretic.  HENT:  Head: Normocephalic and atraumatic.     Right Ear: Tympanic membrane, ear canal and external ear normal. No drainage, swelling or tenderness. No middle ear effusion. There is no impacted cerumen. Tympanic membrane is not erythematous or bulging.     Left Ear: Tympanic membrane, ear canal and external ear normal. No drainage, swelling or tenderness.  No middle ear effusion. There is no impacted cerumen. Tympanic membrane is not erythematous or bulging.     Nose: Nose normal. No congestion or rhinorrhea.     Mouth/Throat:     Mouth: Mucous membranes are moist.     Pharynx: No oropharyngeal exudate or posterior oropharyngeal erythema.   Eyes:     General: No scleral icterus.       Right eye: No discharge.        Left eye: No discharge.     Extraocular Movements: Extraocular movements intact.     Conjunctiva/sclera: Conjunctivae normal.  Cardiovascular:     Rate and Rhythm: Normal rate and regular rhythm.     Heart sounds: Normal heart sounds. No murmur heard.    No friction rub. No gallop.  Pulmonary:     Effort: Pulmonary effort is normal. No respiratory distress.     Breath sounds: Normal breath sounds. No stridor. No wheezing, rhonchi or rales.  Musculoskeletal:     Cervical back: Normal range of motion and neck supple. No rigidity. No muscular tenderness.  Neurological:     General: No focal deficit present.     Mental Status: He is alert and oriented to person, place, and time.  Psychiatric:        Mood and Affect: Mood normal.        Behavior: Behavior normal.        Thought Content: Thought content normal.     Assessment and Plan :   PDMP not reviewed this encounter.  1. Acute non-recurrent sinusitis of other sinus    Deferred imaging given clear cardiopulmonary exam, hemodynamically stable vital signs.  Will start empiric treatment for sinusitis with amoxicillin.  Recommended supportive care otherwise. Counseled patient on potential for adverse effects with medications prescribed/recommended today, ER and return-to-clinic precautions discussed, patient verbalized understanding.    Wallis Bamberg, PA-C 08/08/23 1000
